# Patient Record
Sex: Female | Born: 1950 | Race: White | Hispanic: No | Marital: Single | Smoking: Former smoker
Health system: Southern US, Community
[De-identification: ages and names within clinical notes are randomized; demographics above are authoritative.]

## PROBLEM LIST (undated history)

## (undated) DIAGNOSIS — I4891 Unspecified atrial fibrillation: Secondary | ICD-10-CM

## (undated) DIAGNOSIS — C801 Malignant (primary) neoplasm, unspecified: Secondary | ICD-10-CM

## (undated) DIAGNOSIS — J449 Chronic obstructive pulmonary disease, unspecified: Secondary | ICD-10-CM

---

## 2019-01-02 ENCOUNTER — Emergency Department (HOSPITAL_BASED_OUTPATIENT_CLINIC_OR_DEPARTMENT_OTHER): Payer: Medicare HMO

## 2019-01-02 ENCOUNTER — Other Ambulatory Visit: Payer: Self-pay

## 2019-01-02 ENCOUNTER — Encounter (HOSPITAL_BASED_OUTPATIENT_CLINIC_OR_DEPARTMENT_OTHER): Payer: Self-pay | Admitting: Emergency Medicine

## 2019-01-02 ENCOUNTER — Emergency Department (HOSPITAL_BASED_OUTPATIENT_CLINIC_OR_DEPARTMENT_OTHER)
Admission: EM | Admit: 2019-01-02 | Discharge: 2019-01-02 | Disposition: A | Discharge status: Home / Self Care | Payer: Medicare HMO | Attending: Emergency Medicine | Admitting: Emergency Medicine

## 2019-01-02 DIAGNOSIS — Z9981 Dependence on supplemental oxygen: Secondary | ICD-10-CM | POA: Diagnosis not present

## 2019-01-02 DIAGNOSIS — R079 Chest pain, unspecified: Secondary | ICD-10-CM | POA: Diagnosis present

## 2019-01-02 DIAGNOSIS — Z79899 Other long term (current) drug therapy: Secondary | ICD-10-CM | POA: Insufficient documentation

## 2019-01-02 DIAGNOSIS — Z85118 Personal history of other malignant neoplasm of bronchus and lung: Secondary | ICD-10-CM | POA: Insufficient documentation

## 2019-01-02 DIAGNOSIS — Z7901 Long term (current) use of anticoagulants: Secondary | ICD-10-CM | POA: Diagnosis not present

## 2019-01-02 DIAGNOSIS — R0602 Shortness of breath: Secondary | ICD-10-CM | POA: Insufficient documentation

## 2019-01-02 DIAGNOSIS — J449 Chronic obstructive pulmonary disease, unspecified: Secondary | ICD-10-CM | POA: Diagnosis not present

## 2019-01-02 DIAGNOSIS — R0789 Other chest pain: Secondary | ICD-10-CM

## 2019-01-02 HISTORY — DX: Unspecified atrial fibrillation: I48.91

## 2019-01-02 HISTORY — DX: Malignant (primary) neoplasm, unspecified: C80.1

## 2019-01-02 HISTORY — DX: Chronic obstructive pulmonary disease, unspecified: J44.9

## 2019-01-02 LAB — CBC WITH DIFFERENTIAL/PLATELET
ABS IMMATURE GRANULOCYTES: 0.26 10*3/uL — AB (ref 0.00–0.07)
BASOS PCT: 0 %
Basophils Absolute: 0 10*3/uL (ref 0.0–0.1)
Eosinophils Absolute: 0 10*3/uL (ref 0.0–0.5)
Eosinophils Relative: 0 %
HCT: 32.7 % — ABNORMAL LOW (ref 36.0–46.0)
Hemoglobin: 9.5 g/dL — ABNORMAL LOW (ref 12.0–15.0)
IMMATURE GRANULOCYTES: 3 %
Lymphocytes Relative: 7 %
Lymphs Abs: 0.6 10*3/uL — ABNORMAL LOW (ref 0.7–4.0)
MCH: 24.6 pg — ABNORMAL LOW (ref 26.0–34.0)
MCHC: 29.1 g/dL — ABNORMAL LOW (ref 30.0–36.0)
MCV: 84.7 fL (ref 80.0–100.0)
Monocytes Absolute: 0.2 10*3/uL (ref 0.1–1.0)
Monocytes Relative: 2 %
NEUTROS ABS: 8 10*3/uL — AB (ref 1.7–7.7)
Neutrophils Relative %: 88 %
PLATELETS: 273 10*3/uL (ref 150–400)
RBC: 3.86 MIL/uL — ABNORMAL LOW (ref 3.87–5.11)
RDW: 17.5 % — ABNORMAL HIGH (ref 11.5–15.5)
WBC: 9.1 10*3/uL (ref 4.0–10.5)
nRBC: 0 % (ref 0.0–0.2)

## 2019-01-02 LAB — RAPID URINE DRUG SCREEN, HOSP PERFORMED
Amphetamines: NOT DETECTED
Barbiturates: NOT DETECTED
Benzodiazepines: NOT DETECTED
Cocaine: NOT DETECTED
Opiates: POSITIVE — AB
Tetrahydrocannabinol: NOT DETECTED

## 2019-01-02 LAB — COMPREHENSIVE METABOLIC PANEL
ALBUMIN: 3.6 g/dL (ref 3.5–5.0)
ALT: 18 U/L (ref 0–44)
AST: 17 U/L (ref 15–41)
Alkaline Phosphatase: 56 U/L (ref 38–126)
Anion gap: 7 (ref 5–15)
BUN: 21 mg/dL (ref 8–23)
CO2: 27 mmol/L (ref 22–32)
Calcium: 8.7 mg/dL — ABNORMAL LOW (ref 8.9–10.3)
Chloride: 103 mmol/L (ref 98–111)
Creatinine, Ser: 0.61 mg/dL (ref 0.44–1.00)
GFR calc Af Amer: >60 mL/min (ref >60)
GFR calc non Af Amer: >60 mL/min (ref >60)
Glucose, Bld: 135 mg/dL — ABNORMAL HIGH (ref 70–99)
POTASSIUM: 4.2 mmol/L (ref 3.5–5.1)
Sodium: 137 mmol/L (ref 135–145)
Total Bilirubin: 0.6 mg/dL (ref 0.3–1.2)
Total Protein: 6.4 g/dL — ABNORMAL LOW (ref 6.5–8.1)

## 2019-01-02 LAB — TROPONIN I: Troponin I: <0.03 ng/mL (ref <0.03)

## 2019-01-02 LAB — BRAIN NATRIURETIC PEPTIDE: B Natriuretic Peptide: 78 pg/mL (ref 0.0–100.0)

## 2019-01-02 MED ORDER — IOPAMIDOL (ISOVUE-370) INJECTION 76%
100.0000 mL | Freq: Once | INTRAVENOUS | Status: AC | PRN
  Administered 2019-01-02: 71 mL via INTRAVENOUS

## 2019-01-02 MED ORDER — GABAPENTIN 600 MG PO TABS: 600.0000 mg | ORAL_TABLET | Freq: Three times a day (TID) | ORAL | 0 refills | Status: DC

## 2019-01-02 MED ORDER — OXYCODONE-ACETAMINOPHEN 5-325 MG PO TABS
2.0000 | ORAL_TABLET | Freq: Once | ORAL | Status: AC
  Administered 2019-01-02: 2 via ORAL
  Filled 2019-01-02: qty 2

## 2019-01-02 MED ORDER — MORPHINE SULFATE ER 30 MG PO TBCR
30.0000 mg | EXTENDED_RELEASE_TABLET | Freq: Once | ORAL | Status: DC
  Filled 2019-01-02: qty 1

## 2019-01-02 MED ORDER — IPRATROPIUM-ALBUTEROL 0.5-2.5 (3) MG/3ML IN SOLN
3.0000 mL | Freq: Once | RESPIRATORY_TRACT | Status: AC
  Administered 2019-01-02: 3 mL via RESPIRATORY_TRACT
  Filled 2019-01-02: qty 3

## 2019-01-02 NOTE — ED Provider Notes (Signed)
[facility] Provider Note   CSN: [id number] Arrival date & time: 01/02/19  1650    History   Chief Complaint Chief Complaint  Patient presents with   Shortness of Breath    HPI Chirsty Austin is a 68 y.o. female.     68 year old female with past medical history including lung cancer, atrial fibrillation on Eliquis, COPD on 2 L home oxygen, chronic narcotic use who presents with chest pain and shortness of breath.  Patient states that she has been living in [place] where she has been following with an oncologist.  She states that she was getting injections for her cancer but has not followed up in 4 months to receive this medication.  She was recently admitted to the hospital for COPD and chest pain. She was discharged on 3/10 with levaquin, prednisone and completed these medications. She lives with family and family decided to move to [place] relatively suddenly 4 days ago. She was unpacking the next 2 days and did not call any clinics to establish care here. She reports that she takes MS contin and hydrocodone daily for chronic chest and R shoulder blade pain related to her lung cancer and she ran out of the medication 4 days ago. She has still been taking all of her other medications and denies missing any doses of Eliquis.  Today she began having worsening chest pain and breathing problems worse than her baseline.  She has mild cough but no fevers or sick contacts.  She reports that her feet swelled up while she was riding in the car when they moved but this has improved.  She denies any vomiting or diarrhea.  The history is provided by the patient.  Shortness of Breath    Past Medical History:  Diagnosis Date   Atrial fibrillation ([place])    Cancer ([place])    COPD (chronic obstructive pulmonary disease) ([place])     There are no active problems to display for this patient.   History reviewed. No pertinent surgical history.   OB History   No  obstetric history on file.      Home Medications    Prior to Admission medications   Medication Sig Start Date End Date Taking? Authorizing Provider  albuterol (ACCUNEB) 1.25 MG/3ML nebulizer solution Take 1 ampule by nebulization every 6 (six) hours as needed for wheezing.   Yes [provider]  ALPRAZolam Duanne Moron) 1 MG tablet Take 1 mg by mouth at bedtime as needed for anxiety.   Yes [provider]  apixaban (ELIQUIS) 5 MG TABS tablet Take 5 mg by mouth 2 (two) times daily.   Yes [provider]  atorvastatin (LIPITOR) 40 MG tablet Take 40 mg by mouth daily.   Yes [provider]  carvedilol (COREG) 6.25 MG tablet Take 6.25 mg by mouth 2 (two) times daily with a meal.   Yes [provider]  diltiazem (TIAZAC) 180 MG 24 hr capsule Take 180 mg by mouth daily.   Yes [provider]  Fluticasone-Umeclidin-Vilant (TRELEGY ELLIPTA) 100-62.5-25 MCG/INH AEPB Inhale into the lungs.   Yes [provider]  HYDROcodone-acetaminophen (NORCO) 7.5-325 MG tablet Take 1 tablet by mouth every 4 (four) hours as needed for moderate pain.   Yes [provider]  morphine (MS CONTIN) 15 MG 12 hr tablet Take 15 mg by mouth every 12 (twelve) hours. 2 tabs by mouth in the morning' 1 tab in the afternoon and 2 tabs at bedtime   Yes  [provider]  pantoprazole (PROTONIX) 40 MG tablet Take 40 mg by mouth daily.   Yes [provider]  sotalol (BETAPACE) 80 MG tablet Take 80 mg by mouth 2 (two) times daily.   Yes [provider]  sucralfate (CARAFATE) 1 g tablet Take 1 g by mouth 4 (four) times daily -  with meals and at bedtime.   Yes [provider]  gabapentin (NEURONTIN) 600 MG tablet Take 1 tablet (600 mg total) by mouth 3 (three) times daily for 7 days. 01/02/19 01/09/19  Mikhael Hendriks, Wenda Overland, MD    Family History History reviewed. No pertinent family history.  Social History Social History   Tobacco  Use   Smoking status: Not on file  Substance Use Topics   Alcohol use: Never    Frequency: Never   Drug use: Never     Allergies   Aspirin   Review of Systems Review of Systems  Respiratory: Positive for shortness of breath.    All other systems reviewed and are negative except that which was mentioned in HPI   Physical Exam Updated Vital Signs BP 101/62    Pulse (!) 59    Temp 98.6 F (37 C) (Oral)    Resp 15    Ht 5\' 4"  (1.626 m)    Wt 72.6 kg    SpO2 99%    BMI 27.46 kg/m   Physical Exam Vitals signs and nursing note reviewed.  Constitutional:      General: She is not in acute distress.    Appearance: She is well-developed.     Comments: Chronically ill-appearing  HENT:     Head: Normocephalic and atraumatic.  Eyes:     Conjunctiva/sclera: Conjunctivae normal.     Pupils: Pupils are equal, round, and reactive to light.  Neck:     Musculoskeletal: Neck supple.  Cardiovascular:     Rate and Rhythm: Normal rate and regular rhythm.     Heart sounds: Normal heart sounds. No murmur.  Pulmonary:     Comments: Mild tachypnea without respiratory distress, mild wheezes in right lung fields Abdominal:     General: Bowel sounds are normal. There is no distension.     Palpations: Abdomen is soft.     Tenderness: There is no abdominal tenderness.  Musculoskeletal:     Right lower leg: No edema.     Left lower leg: No edema.  Skin:    General: Skin is warm and dry.  Neurological:     Mental Status: She is alert and oriented to person, place, and time.     Comments: Fluent speech  Psychiatric:        Mood and Affect: Mood is anxious.        Judgment: Judgment normal.      ED Treatments / Results  Labs (all labs ordered are listed, but only abnormal results are displayed) Labs Reviewed  COMPREHENSIVE METABOLIC PANEL - Abnormal; Notable for the following components:      Result Value   Glucose, Bld 135 (*)    Calcium 8.7 (*)    Total Protein 6.4 (*)    All  other components within normal limits  CBC WITH DIFFERENTIAL/PLATELET - Abnormal; Notable for the following components:   RBC 3.86 (*)    Hemoglobin 9.5 (*)    HCT 32.7 (*)    MCH 24.6 (*)    MCHC 29.1 (*)    RDW 17.5 (*)    Neutro Abs 8.0 (*)  Lymphs Abs 0.6 (*)    Abs Immature Granulocytes 0.26 (*)    All other components within normal limits  BRAIN NATRIURETIC PEPTIDE  TROPONIN I  RAPID URINE DRUG SCREEN, HOSP PERFORMED    EKG EKG Interpretation  Date/Time:  Sunday January 02 2019 16:59:55 EDT Ventricular Rate:  66 PR Interval:    QRS Duration: 95 QT Interval:  437 QTC Calculation: 458 R Axis:   10 Text Interpretation:  Sinus rhythm Borderline T abnormalities, inferior leads Baseline wander in lead(s) V6 No previous ECGs available Confirmed by Theotis Burrow [phone]) on 01/02/2019 5:09:39 PM   Radiology Dg Chest 2 View  Result Date: 01/02/2019 CLINICAL DATA:  History of lung cancer.  COPD. EXAM: CHEST - 2 VIEW COMPARISON:  None. FINDINGS: Right anterior chest wall Port-A-Cath is present with tip projecting the superior vena cava. Cardiac contours upper limits of normal. Pulmonary hyperinflation. Bibasilar heterogeneous opacities. No pleural effusion or pneumothorax. Thoracic spine degenerative changes. IMPRESSION: Bibasilar heterogeneous opacities may represent atelectasis or infection. Electronically Signed   By: Lovey Newcomer M.D.   On: 01/02/2019 18:13   Ct Angio Chest Pe W/cm &/or Wo Cm  Result Date: 01/02/2019 CLINICAL DATA:  Pt with COPD experiencing increasing SOB since trip from [place] to [place] a few days ago, on oxygen, hx AFIBHx lung cancer with chemo and radiation EXAM: CT ANGIOGRAPHY CHEST WITH CONTRAST TECHNIQUE: Multidetector CT imaging of the chest was performed using the standard protocol during bolus administration of intravenous contrast. Multiplanar CT image reconstructions and MIPs were obtained to evaluate the vascular anatomy. CONTRAST:  31mL ISOVUE-370  IOPAMIDOL (ISOVUE-370) INJECTION 76% COMPARISON:  Current chest radiograph. FINDINGS: Cardiovascular: There is satisfactory opacification of the pulmonary arteries to the segmental level. There is no evidence of a pulmonary embolism. Heart is normal in size. No pericardial effusion. Minimal left coronary artery calcifications. Main pulmonary artery is prominent measuring 3.6 cm. Mild dilation of the right pulmonary artery, 3.0 cm. Aorta is normal in caliber. Mild aortic atherosclerotic disease noted. No aortic dissection. Mediastinum/Nodes: There Is abnormal soft tissue along the superior right hilum measuring approximately 2.9 x 2.2 x 2.5 cm. The right upper lobe bronchus is occluded at the level of the soft tissue. This may be chronic due to treatment related radiation scarring. Residual/recurrent lung carcinoma is not excluded. No enlarged mediastinal, hilar, or axillary lymph nodes. Thyroid gland, trachea, and esophagus demonstrate no significant findings. Lungs/Pleura: There is patchy and linear/reticular opacities extending posteriorly from the right hilar structures consistent with treatment related scarring. One component of this centered on image 38, series 6, is more nodular in configuration, measuring 17 x 11 x 10 mm. No other evidence of a nodule. No lung masses. Despite occlusion of the right lobe bronchus, the upper lobe is aerated presumably from collateral drift. Additional linear and reticular type opacities noted most evident in the anteromedial left upper lobe and at the lung bases consistent with a combination of scarring and subsegmental atelectasis. 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 There is no evidence of pneumonia and no pulmonary edema. Advanced changes of centrilobular emphysema are noted bilaterally. No pleural effusion or pneumothorax. Upper Abdomen: No acute abnormality. Musculoskeletal: No acute fracture. No osteoblastic or osteolytic lesions. Review of the MIP images confirms  the above findings. IMPRESSION: 1. No evidence of a pulmonary embolism. 2. No acute findings. Lung base opacities noted on the current chest radiograph are due to a combination of chronic appearing lung base scarring and/or subsegmental atelectasis and bilateral Bochdalek's hernias.  3. Abnormal soft tissue surrounds the superior right hilar structures occluding the right upper lobe bronchus. This may be chronic scarring from radiation therapy. However, recurrent/residual tumor is also possible. Posterior to this is an irregular nodule measuring 17 x 10 x 11 mm, confluent with linear/reticular opacities. This may all be scarring. The nodular component could reflect active neoplastic disease. Comparison with prior chest CTs, if they are available, would be helpful to establish the chronicity of these findings. Otherwise, recommend short-term follow-up with repeat chest CT in 2 3 months to reassess for stability. 4. Advanced emphysema. 5. Mild aortic atherosclerosis. Aortic Atherosclerosis (ICD10-I70.0) and Emphysema (ICD10-J43.9). Electronically Signed   By: Lajean Manes M.D.   On: 01/02/2019 19:25    Procedures Procedures (including critical care time)  Medications Ordered in ED Medications  ipratropium-albuterol (DUONEB) 0.5-2.5 (3) MG/3ML nebulizer solution 3 mL (3 mLs Nebulization Given 01/02/19 1730)  oxyCODONE-acetaminophen (PERCOCET/ROXICET) 5-325 MG per tablet 2 tablet (2 tablets Oral Given 01/02/19 1811)  iopamidol (ISOVUE-370) 76 % injection 100 mL (71 mLs Intravenous Contrast Given 01/02/19 1843)     Initial Impression / Assessment and Plan / ED Course  I have reviewed the triage vital signs and the nursing notes.  Pertinent labs & imaging results that were available during my care of the patient were reviewed by me and considered in my medical decision making (see chart for details).        Chronically ill-appearing but nontoxic on exam, afebrile, vital signs stable on home oxygen  level.  Occasional wheeze but no significant wheezing on exam.  She did appear somewhat anxious during conversation.  Lab work shows normal CMP, negative troponin and BNP, normal WBC count, anemia with hemoglobin of 9.5 is not surprising given underlying cancer.  Chest x-ray showed questionable opacities therefore obtain CTA for more information.  CTA negative for PE, no acute findings.  She has scarring, advanced emphysema, and several areas that may be scarring versus residual tumor.  She has no indication for repeat course of antibiotics and I do not feel that she needs another steroid course based on current exam.  I have emphasized the importance of establishing care with an oncologist in this area as soon as possible.  I spoke with oncologist on-call, Dr. Walden Field, who took the patient's information and will notify the clinic.  Provided patient with contact information for Dr. Marin Olp upstairs.  She asked multiple times for narcotic prescriptions but I explained that I do not provide narcotics for any chronic pain conditions especially given that I have no background information on the patient or previous charts/workup.  I explained that she would have to establish care with a provider to discuss pain management options.  I did provide with refill of gabapentin as she says she is about to run out of the medication.  She states she has all of her other medications.  Emphasized importance of medication compliance especially with Eliquis.  Return precautions reviewed. Final Clinical Impressions(s) / ED Diagnoses   Final diagnoses:  Shortness of breath  Atypical chest pain    ED Discharge Orders         Ordered    gabapentin (NEURONTIN) 600 MG tablet  3 times daily     01/02/19 1956           Leslyn Monda, Wenda Overland, MD 01/02/19 2002

## 2019-01-02 NOTE — ED Notes (Signed)
Pt states she is not driving, aware she needs a driver when discharged.

## 2019-01-02 NOTE — Discharge Instructions (Signed)
IT IS VERY IMPORTANT FOR YOU TO ESTABLISH CARE WITH AN ONCOLOGIST (CANCER DOCTOR) HERE OR AT [facility]. CALL IN THE MORNING TO SCHEDULE APPOINTMENT. CONTINUE ALL YOUR MEDICATIONS.

## 2019-01-02 NOTE — ED Triage Notes (Addendum)
Reports history of COPD with shortness of breath which began today.  Oxygen saturation 88% on RA.  States she currently wears 2L O2 via nasal cannula at all times.  Denies productive cough, fever.  Recent extended period of sitting when moving from [place] last week.  States that she was seen from same 1 week ago.  To room via wheelchair.  Oxygen saturation increased to 96% on 2L oxygen.

## 2019-01-02 NOTE — ED Notes (Signed)
ED Provider at bedside. 

## 2019-01-03 ENCOUNTER — Emergency Department (HOSPITAL_COMMUNITY)
Admission: EM | Admit: 2019-01-03 | Discharge: 2019-01-03 | Disposition: A | Discharge status: Home / Self Care | Payer: Medicare HMO | Attending: Emergency Medicine | Admitting: Emergency Medicine

## 2019-01-03 ENCOUNTER — Telehealth: Payer: Self-pay | Admitting: *Deleted

## 2019-01-03 ENCOUNTER — Encounter (HOSPITAL_COMMUNITY): Payer: Self-pay | Admitting: Emergency Medicine

## 2019-01-03 ENCOUNTER — Other Ambulatory Visit: Payer: Self-pay

## 2019-01-03 ENCOUNTER — Telehealth: Payer: Self-pay | Admitting: Hematology & Oncology

## 2019-01-03 ENCOUNTER — Emergency Department (HOSPITAL_COMMUNITY): Payer: Medicare HMO

## 2019-01-03 DIAGNOSIS — R0602 Shortness of breath: Secondary | ICD-10-CM | POA: Diagnosis present

## 2019-01-03 DIAGNOSIS — Z79899 Other long term (current) drug therapy: Secondary | ICD-10-CM | POA: Insufficient documentation

## 2019-01-03 DIAGNOSIS — Z7901 Long term (current) use of anticoagulants: Secondary | ICD-10-CM | POA: Insufficient documentation

## 2019-01-03 DIAGNOSIS — Z87891 Personal history of nicotine dependence: Secondary | ICD-10-CM | POA: Insufficient documentation

## 2019-01-03 DIAGNOSIS — J441 Chronic obstructive pulmonary disease with (acute) exacerbation: Secondary | ICD-10-CM | POA: Insufficient documentation

## 2019-01-03 DIAGNOSIS — I4891 Unspecified atrial fibrillation: Secondary | ICD-10-CM | POA: Diagnosis not present

## 2019-01-03 LAB — BASIC METABOLIC PANEL
Anion gap: 9 (ref 5–15)
BUN: 24 mg/dL — ABNORMAL HIGH (ref 8–23)
CO2: 28 mmol/L (ref 22–32)
Calcium: 9.1 mg/dL (ref 8.9–10.3)
Chloride: 106 mmol/L (ref 98–111)
Creatinine, Ser: 0.77 mg/dL (ref 0.44–1.00)
GFR calc Af Amer: >60 mL/min (ref >60)
GFR calc non Af Amer: >60 mL/min (ref >60)
Glucose, Bld: 95 mg/dL (ref 70–99)
Potassium: 3.4 mmol/L — ABNORMAL LOW (ref 3.5–5.1)
Sodium: 143 mmol/L (ref 135–145)

## 2019-01-03 LAB — CBC
HCT: 32.1 % — ABNORMAL LOW (ref 36.0–46.0)
Hemoglobin: 9.2 g/dL — ABNORMAL LOW (ref 12.0–15.0)
MCH: 24.5 pg — ABNORMAL LOW (ref 26.0–34.0)
MCHC: 28.7 g/dL — ABNORMAL LOW (ref 30.0–36.0)
MCV: 85.4 fL (ref 80.0–100.0)
PLATELETS: 295 10*3/uL (ref 150–400)
RBC: 3.76 MIL/uL — AB (ref 3.87–5.11)
RDW: 17.8 % — ABNORMAL HIGH (ref 11.5–15.5)
WBC: 8.6 10*3/uL (ref 4.0–10.5)
nRBC: 0 % (ref 0.0–0.2)

## 2019-01-03 MED ORDER — HYDROCODONE-ACETAMINOPHEN 5-325 MG PO TABS: 1.0000 | ORAL_TABLET | Freq: Four times a day (QID) | ORAL | 0 refills | Status: DC | PRN

## 2019-01-03 MED ORDER — MAGNESIUM SULFATE 2 GM/50ML IV SOLN
2.0000 g | Freq: Once | INTRAVENOUS | Status: AC
  Administered 2019-01-03: 2 g via INTRAVENOUS
  Filled 2019-01-03: qty 50

## 2019-01-03 MED ORDER — ALBUTEROL SULFATE (2.5 MG/3ML) 0.083% IN NEBU
5.0000 mg | INHALATION_SOLUTION | Freq: Once | RESPIRATORY_TRACT | Status: AC
  Administered 2019-01-03: 5 mg via RESPIRATORY_TRACT
  Filled 2019-01-03: qty 6

## 2019-01-03 MED ORDER — OXYCODONE-ACETAMINOPHEN 5-325 MG PO TABS
1.0000 | ORAL_TABLET | Freq: Once | ORAL | Status: AC
  Administered 2019-01-03: 1 via ORAL
  Filled 2019-01-03: qty 1

## 2019-01-03 MED ORDER — PREDNISONE 20 MG PO TABS: ORAL_TABLET | ORAL | 0 refills | Status: DC

## 2019-01-03 MED ORDER — METHYLPREDNISOLONE SODIUM SUCC 125 MG IJ SOLR
125.0000 mg | Freq: Once | INTRAMUSCULAR | Status: AC
  Administered 2019-01-03: 125 mg via INTRAVENOUS
  Filled 2019-01-03: qty 2

## 2019-01-03 NOTE — Telephone Encounter (Signed)
Call from [facility] at [place] regarding pt records. RN advised she will fax pt records for MD to review.

## 2019-01-03 NOTE — ED Triage Notes (Signed)
Pt with COPD reports shortness of breath, back and chest discomfort. She reports symptoms have been on going for a couple of days seen at [facility] received a breathing treatment but symptoms persists. She wears 2L home oxygen, saturations of 85% now 90 on  3L. Denies fever or cough.   Hx of Lung CA.

## 2019-01-03 NOTE — Discharge Instructions (Addendum)
Follow up with her doctor tomorrow as planned

## 2019-01-03 NOTE — Telephone Encounter (Signed)
Received call from patient to sch office visit to refill medications per ER visit over the weekend. Transferred call to desk RN for patient to speak with RN. Placed patient on Dr Marin Olp schedule for 01/20/19 at 0800

## 2019-01-03 NOTE — Telephone Encounter (Signed)
Call to pt regarding records and prior treatment and location. Pt advised the following:  She lived in [place] w/son treated w/Radiation and 6 treatments of Chemotherapy ( 1 infusion q 3wks, unsure name of drug(s) at [facility].  Pt was last seen 1/27 by Dr. Ivin Poot and had a CT scan. She was advised the mass was gone. Pt did not receive her scheduled treatment that day as she did not have time.Pt then left [place] abruptly due to not getting along w/her son and moved in w/her daughter Carolyne Fiscal in [place]  While in [place] pt did not receive any care or treatments. She again abruptly left [place] on Wednesday 3/11 with her daughter who moved here to be with her boyfriend. Pt has an appt w/ Ali Lowe, NP for management of Pain. Discussed with pt I will be reaching out to [facility] in [place] for records. If we are unable to get pt records appt on 4/2 may need to be rescheduled. Pt thanked me for the call, no further concerns at this time [facility], message left with answering service regarding mutual pt records, requested Dr. Ivin Poot nurse/office call back as pt has upcoming appt.

## 2019-01-03 NOTE — ED Provider Notes (Signed)
[facility] EMERGENCY DEPARTMENT Provider Note   CSN: [id number] Arrival date & time: 01/03/19  1604    History   Chief Complaint Chief Complaint  Patient presents with   COPD   Shortness of Breath    HPI Sophia Austin is a 68 y.o. female.     . She complained of bowel shortness of breath with  Left lower back pain.   Patient was seen yesterday in the emergency department had a CT angiogram the chest that was negative for PE  The history is provided by the patient. No language interpreter was used.  COPD  This is a recurrent problem. The current episode started more than 2 days ago. The problem occurs constantly. The problem has not changed since onset.Pertinent negatives include no chest pain, no abdominal pain and no headaches. Nothing aggravates the symptoms. She has tried nothing for the symptoms. The treatment provided no relief.    Past Medical History:  Diagnosis Date   Atrial fibrillation ([place])    Cancer ([place])    COPD (chronic obstructive pulmonary disease) ([place])     There are no active problems to display for this patient.   History reviewed. No pertinent surgical history.   OB History   No obstetric history on file.      Home Medications    Prior to Admission medications   Medication Sig Start Date End Date Taking? Authorizing Provider  albuterol (ACCUNEB) 1.25 MG/3ML nebulizer solution Take 1 ampule by nebulization every 6 (six) hours as needed for wheezing.   Yes [provider]  albuterol (PROVENTIL HFA;VENTOLIN HFA) 108 (90 Base) MCG/ACT inhaler Inhale 2 puffs into the lungs every 6 (six) hours as needed for wheezing or shortness of breath.   Yes [provider]  ALPRAZolam Duanne Moron) 1 MG tablet Take 1 mg by mouth at bedtime as needed for anxiety.   Yes [provider]  apixaban (ELIQUIS) 5 MG TABS tablet Take 5 mg by mouth 2 (two) times daily.   Yes [provider]  atorvastatin (LIPITOR)  40 MG tablet Take 40 mg by mouth daily.   Yes [provider]  carvedilol (COREG) 6.25 MG tablet Take 6.25 mg by mouth 2 (two) times daily with a meal.   Yes [provider]  diltiazem (TIAZAC) 180 MG 24 hr capsule Take 180 mg by mouth daily.   Yes [provider]  Fluticasone-Umeclidin-Vilant (TRELEGY ELLIPTA) 100-62.5-25 MCG/INH AEPB Inhale 1 puff into the lungs daily.    Yes [provider]  gabapentin (NEURONTIN) 600 MG tablet Take 1 tablet (600 mg total) by mouth 3 (three) times daily for 7 days. 01/02/19 01/09/19 Yes Little, Wenda Overland, MD  morphine (MS CONTIN) 15 MG 12 hr tablet Take 15 mg by mouth every 12 (twelve) hours. 2 tabs by mouth in the morning' 1 tab in the afternoon and 2 tabs at bedtime   Yes [provider]  pantoprazole (PROTONIX) 40 MG tablet Take 40 mg by mouth daily.   Yes [provider]  sotalol (BETAPACE) 80 MG tablet Take 80 mg by mouth 2 (two) times daily.   Yes [provider]  sucralfate (CARAFATE) 1 g tablet Take 1 g by mouth 4 (four) times daily -  with meals and at bedtime.   Yes [provider]  HYDROcodone-acetaminophen (NORCO/VICODIN) 5-325 MG tablet Take 1 tablet by mouth every 6 (six) hours as needed for moderate pain. 01/03/19   Milton Ferguson, MD  predniSONE (DELTASONE) 20  MG tablet 2 tabs po daily x 3 days 01/03/19   Milton Ferguson, MD    Family History No family history on file.  Social History Social History   Tobacco Use   Smoking status: Former Smoker   Smokeless tobacco: Never Used  Substance Use Topics   Alcohol use: Never    Frequency: Never   Drug use: Never     Allergies   Aspirin   Review of Systems Review of Systems  Constitutional: Negative for appetite change and fatigue.  HENT: Negative for congestion, ear discharge and sinus pressure.   Eyes: Negative for discharge.  Respiratory: Positive for wheezing. Negative for cough.   Cardiovascular: Negative  for chest pain.  Gastrointestinal: Negative for abdominal pain and diarrhea.  Genitourinary: Negative for frequency and hematuria.  Musculoskeletal: Negative for back pain.  Skin: Negative for rash.  Neurological: Negative for seizures and headaches.  Psychiatric/Behavioral: Negative for hallucinations.     Physical Exam Updated Vital Signs BP (!) 117/48 (BP Location: Right Arm)    Pulse 68    Temp 97.8 F (36.6 C) (Oral)    Resp 16    Ht 5\' 4"  (1.626 m)    Wt 71.7 kg    SpO2 98%    BMI 27.12 kg/m   Physical Exam Vitals signs and nursing note reviewed.  Constitutional:      Appearance: She is well-developed.  HENT:     Head: Normocephalic.     Nose: Nose normal.  Eyes:     General: No scleral icterus.    Conjunctiva/sclera: Conjunctivae normal.  Neck:     Musculoskeletal: Neck supple.     Thyroid: No thyromegaly.  Cardiovascular:     Rate and Rhythm: Normal rate and regular rhythm.     Heart sounds: No murmur. No friction rub. No gallop.   Pulmonary:     Breath sounds: No stridor. Wheezing present. No rales.  Chest:     Chest wall: No tenderness.  Abdominal:     General: There is no distension.     Tenderness: There is no abdominal tenderness. There is no rebound.  Musculoskeletal: Normal range of motion.  Lymphadenopathy:     Cervical: No cervical adenopathy.  Skin:    Findings: No erythema or rash.  Neurological:     Mental Status: She is oriented to person, place, and time.     Motor: No abnormal muscle tone.     Coordination: Coordination normal.  Psychiatric:        Behavior: Behavior normal.      ED Treatments / Results  Labs (all labs ordered are listed, but only abnormal results are displayed) Labs Reviewed  BASIC METABOLIC PANEL - Abnormal; Notable for the following components:      Result Value   Potassium 3.4 (*)    BUN 24 (*)    All other components within normal limits  CBC - Abnormal; Notable for the following components:   RBC 3.76 (*)      Hemoglobin 9.2 (*)    HCT 32.1 (*)    MCH 24.5 (*)    MCHC 28.7 (*)    RDW 17.8 (*)    All other components within normal limits    EKG None  Radiology Dg Chest 2 View  Result Date: 01/03/2019 CLINICAL DATA:  Sob. Pt stated that she's been feeling sob w/ or w/out exertion for 2 days. Pt also experiencing some middle chest pain. Pt mentioned that she has A-fib and stated that  her heart has been "beating weird". Hx; COPD, A-fib, former smoker of 50 years, quit 1 year ago. EXAM: CHEST - 2 VIEW COMPARISON:  01/02/2019 FINDINGS: Cardiac silhouette is normal in size and configuration. No mediastinal or hilar masses. No convincing adenopathy. Lungs are hyperexpanded. Stable scarring above and posterior to the right hilum. Mild reticular scarring or subsegmental atelectasis in the anterior lung bases. No evidence of pneumonia or pulmonary edema. No pleural effusion or pneumothorax. Right anterior chest wall Port-A-Cath is stable, tip lying in the lower superior vena cava. Skeletal structures are intact. IMPRESSION: 1. No acute cardiopulmonary disease. 2. COPD. Right sided scarring projecting posterior to the superior right hilum, presumably treatment related scarring in this patient with a reported history of lung carcinoma. Electronically Signed   By: Lajean Manes M.D.   On: 01/03/2019 19:11   Dg Chest 2 View  Result Date: 01/02/2019 CLINICAL DATA:  History of lung cancer.  COPD. EXAM: CHEST - 2 VIEW COMPARISON:  None. FINDINGS: Right anterior chest wall Port-A-Cath is present with tip projecting the superior vena cava. Cardiac contours upper limits of normal. Pulmonary hyperinflation. Bibasilar heterogeneous opacities. No pleural effusion or pneumothorax. Thoracic spine degenerative changes. IMPRESSION: Bibasilar heterogeneous opacities may represent atelectasis or infection. Electronically Signed   By: Lovey Newcomer M.D.   On: 01/02/2019 18:13   Ct Angio Chest Pe W/cm &/or Wo Cm  Result Date:  01/02/2019 CLINICAL DATA:  Pt with COPD experiencing increasing SOB since trip from [place] to [place] a few days ago, on oxygen, hx AFIBHx lung cancer with chemo and radiation EXAM: CT ANGIOGRAPHY CHEST WITH CONTRAST TECHNIQUE: Multidetector CT imaging of the chest was performed using the standard protocol during bolus administration of intravenous contrast. Multiplanar CT image reconstructions and MIPs were obtained to evaluate the vascular anatomy. CONTRAST:  10mL ISOVUE-370 IOPAMIDOL (ISOVUE-370) INJECTION 76% COMPARISON:  Current chest radiograph. FINDINGS: Cardiovascular: There is satisfactory opacification of the pulmonary arteries to the segmental level. There is no evidence of a pulmonary embolism. Heart is normal in size. No pericardial effusion. Minimal left coronary artery calcifications. Main pulmonary artery is prominent measuring 3.6 cm. Mild dilation of the right pulmonary artery, 3.0 cm. Aorta is normal in caliber. Mild aortic atherosclerotic disease noted. No aortic dissection. Mediastinum/Nodes: There Is abnormal soft tissue along the superior right hilum measuring approximately 2.9 x 2.2 x 2.5 cm. The right upper lobe bronchus is occluded at the level of the soft tissue. This may be chronic due to treatment related radiation scarring. Residual/recurrent lung carcinoma is not excluded. No enlarged mediastinal, hilar, or axillary lymph nodes. Thyroid gland, trachea, and esophagus demonstrate no significant findings. Lungs/Pleura: There is patchy and linear/reticular opacities extending posteriorly from the right hilar structures consistent with treatment related scarring. One component of this centered on image 38, series 6, is more nodular in configuration, measuring 17 x 11 x 10 mm. No other evidence of a nodule. No lung masses. Despite occlusion of the right lobe bronchus, the upper lobe is aerated presumably from collateral drift. Additional linear and reticular type opacities noted most evident  in the anteromedial left upper lobe and at the lung bases consistent with a combination of scarring and subsegmental atelectasis. 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 There is no evidence of pneumonia and no pulmonary edema. Advanced changes of centrilobular emphysema are noted bilaterally. No pleural effusion or pneumothorax. Upper Abdomen: No acute abnormality. Musculoskeletal: No acute fracture. No osteoblastic or osteolytic lesions. Review of the MIP images confirms the above  findings. IMPRESSION: 1. No evidence of a pulmonary embolism. 2. No acute findings. Lung base opacities noted on the current chest radiograph are due to a combination of chronic appearing lung base scarring and/or subsegmental atelectasis and bilateral Bochdalek's hernias. 3. Abnormal soft tissue surrounds the superior right hilar structures occluding the right upper lobe bronchus. This may be chronic scarring from radiation therapy. However, recurrent/residual tumor is also possible. Posterior to this is an irregular nodule measuring 17 x 10 x 11 mm, confluent with linear/reticular opacities. This may all be scarring. The nodular component could reflect active neoplastic disease. Comparison with prior chest CTs, if they are available, would be helpful to establish the chronicity of these findings. Otherwise, recommend short-term follow-up with repeat chest CT in 2 3 months to reassess for stability. 4. Advanced emphysema. 5. Mild aortic atherosclerosis. Aortic Atherosclerosis (ICD10-I70.0) and Emphysema (ICD10-J43.9). Electronically Signed   By: Lajean Manes M.D.   On: 01/02/2019 19:25    Procedures Procedures (including critical care time)  Medications Ordered in ED Medications  albuterol (PROVENTIL) (2.5 MG/3ML) 0.083% nebulizer solution 5 mg (5 mg Nebulization Given 01/03/19 1644)  methylPREDNISolone sodium succinate (SOLU-MEDROL) 125 mg/2 mL injection 125 mg (125 mg Intravenous Given 01/03/19 1732)  magnesium sulfate IVPB  2 g 50 mL (0 g Intravenous Stopped 01/03/19 1916)  oxyCODONE-acetaminophen (PERCOCET/ROXICET) 5-325 MG per tablet 1 tablet (1 tablet Oral Given 01/03/19 1730)  oxyCODONE-acetaminophen (PERCOCET/ROXICET) 5-325 MG per tablet 1 tablet (1 tablet Oral Given 01/03/19 2033)     Initial Impression / Assessment and Plan / ED Course  I have reviewed the triage vital signs and the nursing notes.  Pertinent labs & imaging results that were available during my care of the patient were reviewed by me and considered in my medical decision making (see chart for details).        X-rays unremarkable. Patient with mild exacerbation of COPD. She improved but never treated with Zomig sent home with some prednisone also given some pain medicine for her musculoskeletal pain in her left flank area. Patient is to see her family doctor tomorrow  Final Clinical Impressions(s) / ED Diagnoses   Final diagnoses:  COPD exacerbation [facility])    ED Discharge Orders         Ordered    predniSONE (DELTASONE) 20 MG tablet     01/03/19 2049    HYDROcodone-acetaminophen (NORCO/VICODIN) 5-325 MG tablet  Every 6 hours PRN     01/03/19 2049           Milton Ferguson, MD 01/03/19 2053

## 2019-01-03 NOTE — ED Notes (Signed)
Patient transported to X-ray 

## 2019-01-04 ENCOUNTER — Telehealth: Payer: Self-pay | Admitting: Family Medicine

## 2019-01-04 ENCOUNTER — Other Ambulatory Visit: Payer: Self-pay

## 2019-01-04 ENCOUNTER — Encounter: Payer: Self-pay | Admitting: Family Medicine

## 2019-01-04 ENCOUNTER — Ambulatory Visit (INDEPENDENT_AMBULATORY_CARE_PROVIDER_SITE_OTHER): Payer: Medicare HMO | Admitting: Family Medicine

## 2019-01-04 VITALS — BP 136/80 | HR 87 | Temp 97.8°F | Ht 64.0 in | Wt 158.0 lb

## 2019-01-04 DIAGNOSIS — I2510 Atherosclerotic heart disease of native coronary artery without angina pectoris: Secondary | ICD-10-CM | POA: Diagnosis not present

## 2019-01-04 DIAGNOSIS — I480 Paroxysmal atrial fibrillation: Secondary | ICD-10-CM | POA: Insufficient documentation

## 2019-01-04 DIAGNOSIS — C3401 Malignant neoplasm of right main bronchus: Secondary | ICD-10-CM | POA: Diagnosis not present

## 2019-01-04 DIAGNOSIS — G894 Chronic pain syndrome: Secondary | ICD-10-CM | POA: Insufficient documentation

## 2019-01-04 DIAGNOSIS — J449 Chronic obstructive pulmonary disease, unspecified: Secondary | ICD-10-CM | POA: Insufficient documentation

## 2019-01-04 DIAGNOSIS — G893 Neoplasm related pain (acute) (chronic): Secondary | ICD-10-CM

## 2019-01-04 MED ORDER — GABAPENTIN 600 MG PO TABS: 600.0000 mg | ORAL_TABLET | Freq: Three times a day (TID) | ORAL | 0 refills | Status: AC

## 2019-01-04 MED ORDER — MORPHINE SULFATE ER 15 MG PO TBCR: 15.0000 mg | EXTENDED_RELEASE_TABLET | Freq: Two times a day (BID) | ORAL | 0 refills | Status: DC

## 2019-01-04 MED ORDER — MORPHINE SULFATE ER 15 MG PO TBCR: 15.0000 mg | EXTENDED_RELEASE_TABLET | Freq: Two times a day (BID) | ORAL | 0 refills | Status: AC

## 2019-01-04 MED ORDER — PREDNISONE 20 MG PO TABS: 20.0000 mg | ORAL_TABLET | Freq: Every day | ORAL | 1 refills | Status: AC

## 2019-01-04 MED ORDER — HYDROCODONE-ACETAMINOPHEN 5-325 MG PO TABS: 1.0000 | ORAL_TABLET | Freq: Four times a day (QID) | ORAL | 0 refills | Status: AC | PRN

## 2019-01-04 NOTE — Progress Notes (Addendum)
Established Patient Office Visit  Subjective:  Patient ID: Sophia Austin, female    DOB: [birth year]  Age: 68 y.o. MRN: [id number]  CC:  Chief Complaint  Patient presents with  . Establish Care    HPI Sophia Austin presents for establishment of care and refill of needed medicines.  Significant past medical history of lung cancer that appears to have/is involved her right bronchus over the last 2 years.  It has been treated in the past with radiation and chemotherapy.  She is scheduled to see her oncologist on the second of next month.  She has significant pain from this cancer she reports.  She tells me that her oncologist back in [place] has been treating her pain with morphine 15 mg twice daily, Norco every 6 as needed and Neurontin 600 mg 3 times daily.  She is unaware of any bony metastases.  She tells me the majority of her pain is in her right shoulder.  She tells me that she has been told it is referred pain.  She has a history of COPD.  She is dependent on oxygen therapy 24/7.  She is on chronic prednisone therapy due to frequent exacerbations requiring emergency room visits.  She had been on 10 mg of prednisone daily that was recently increased to 20 mg daily 6 weeks ago.  She was unable to have a flu shot this year because of her cancer treatment.  She has a history of paroxysmal atrial fibrillation.  She tells me that she has had a "light heart attack".  She has had ablation therapy.  She recently moved into this area from [place] to be with her daughter.  She is not married and single.  She does not currently smoke.  She does not drink alcohol or use illicit drugs.  She had been taking Xanax she tells me her depression but has been out of it for quite some time.  She had taken Ambien in the past and had what sounds like sleep aberration.  Past Medical History:  Diagnosis Date  . Atrial fibrillation ([place])   . Cancer ([place])   . COPD (chronic obstructive pulmonary disease)  ([place])     History reviewed. No pertinent surgical history.  History reviewed. No pertinent family history.  Social History   Socioeconomic History  . Marital status: Single    Spouse name: Not on file  . Number of children: Not on file  . Years of education: Not on file  . Highest education level: Not on file  Occupational History  . Not on file  Social Needs  . Financial resource strain: Not on file  . Food insecurity:    Worry: Not on file    Inability: Not on file  . Transportation needs:    Medical: Not on file    Non-medical: Not on file  Tobacco Use  . Smoking status: Former Research scientist (life sciences)  . Smokeless tobacco: Never Used  Substance and Sexual Activity  . Alcohol use: Never    Frequency: Never  . Drug use: Never  . Sexual activity: Never  Lifestyle  . Physical activity:    Days per week: Not on file    Minutes per session: Not on file  . Stress: Not on file  Relationships  . Social connections:    Talks on phone: Not on file    Gets together: Not on file    Attends religious service: Not on file    Active member of club or organization: Not  on file    Attends meetings of clubs or organizations: Not on file    Relationship status: Not on file  . Intimate partner violence:    Fear of current or ex partner: Not on file    Emotionally abused: Not on file    Physically abused: Not on file    Forced sexual activity: Not on file  Other Topics Concern  . Not on file  Social History Narrative  . Not on file    Outpatient Medications Prior to Visit  Medication Sig Dispense Refill  . albuterol (ACCUNEB) 1.25 MG/3ML nebulizer solution Take 1 ampule by nebulization every 6 (six) hours as needed for wheezing.    Marland Kitchen albuterol (PROVENTIL HFA;VENTOLIN HFA) 108 (90 Base) MCG/ACT inhaler Inhale 2 puffs into the lungs every 6 (six) hours as needed for wheezing or shortness of breath.    Marland Kitchen apixaban (ELIQUIS) 5 MG TABS tablet Take 5 mg by mouth 2 (two) times daily.    Marland Kitchen  atorvastatin (LIPITOR) 40 MG tablet Take 40 mg by mouth daily.    . carvedilol (COREG) 6.25 MG tablet Take 6.25 mg by mouth 2 (two) times daily with a meal.    . diltiazem (TIAZAC) 180 MG 24 hr capsule Take 180 mg by mouth daily.    . Fluticasone-Umeclidin-Vilant (TRELEGY ELLIPTA) 100-62.5-25 MCG/INH AEPB Inhale 1 puff into the lungs daily.     . pantoprazole (PROTONIX) 40 MG tablet Take 40 mg by mouth daily.    . sotalol (BETAPACE) 80 MG tablet Take 80 mg by mouth 2 (two) times daily.    . sucralfate (CARAFATE) 1 g tablet Take 1 g by mouth 4 (four) times daily -  with meals and at bedtime.    . ALPRAZolam (XANAX) 1 MG tablet Take 1 mg by mouth at bedtime as needed for anxiety.    . gabapentin (NEURONTIN) 600 MG tablet Take 1 tablet (600 mg total) by mouth 3 (three) times daily for 7 days. 21 tablet 0  . HYDROcodone-acetaminophen (NORCO/VICODIN) 5-325 MG tablet Take 1 tablet by mouth every 6 (six) hours as needed for moderate pain. 20 tablet 0  . morphine (MS CONTIN) 15 MG 12 hr tablet Take 15 mg by mouth every 12 (twelve) hours. 2 tabs by mouth in the morning' 1 tab in the afternoon and 2 tabs at bedtime    . predniSONE (DELTASONE) 20 MG tablet 2 tabs po daily x 3 days 6 tablet 0   No facility-administered medications prior to visit.     Allergies  Allergen Reactions  . Aspirin Hives and Rash    ROS Review of Systems  Constitutional: Positive for fatigue. Negative for chills, diaphoresis, fever and unexpected weight change.  HENT: Negative.   Eyes: Negative for photophobia and visual disturbance.  Respiratory: Positive for shortness of breath. Negative for chest tightness and wheezing.   Cardiovascular: Positive for chest pain and palpitations.  Gastrointestinal: Negative.   Genitourinary: Negative.   Musculoskeletal: Negative for gait problem and joint swelling.  Skin: Negative for pallor and rash.  Allergic/Immunologic: Positive for immunocompromised state.  Neurological: Negative  for light-headedness and headaches.  Hematological: Bruises/bleeds easily.      Objective:    Physical Exam  Constitutional: She is cooperative.  Non-toxic appearance. She has a sickly appearance. She appears ill. No distress.  HENT:  Head: Normocephalic and atraumatic.  Eyes: Pupils are equal, round, and reactive to light. Conjunctivae and EOM are normal.  Neck: Neck supple. No JVD present. No  thyroid mass and no thyromegaly present.  Cardiovascular: Normal rate and regular rhythm. Exam reveals distant heart sounds.  Pulmonary/Chest: No accessory muscle usage. No respiratory distress. She has decreased breath sounds. She has no wheezes. She has no rhonchi. She has no rales.  Neurological: She is alert.  Skin: Bruising and ecchymosis noted.  Psychiatric: Her speech is normal and behavior is normal. Her mood appears not anxious. She does not exhibit a depressed mood.    BP 136/80   Pulse 87   Temp 97.8 F (36.6 C) (Oral)   Ht 5\' 4"  (1.626 m)   Wt 158 lb (71.7 kg)   SpO2 93%   BMI 27.12 kg/m  Wt Readings from Last 3 Encounters:  01/04/19 158 lb (71.7 kg)  01/03/19 158 lb (71.7 kg)  01/02/19 160 lb (72.6 kg)   BP Readings from Last 3 Encounters:  01/04/19 136/80  01/03/19 115/61  01/02/19 101/62   Guideline developer:  UpToDate (see UpToDate for funding source) Date Released: June 2014  Health Maintenance Due  Topic Date Due  . Hepatitis C Screening  January 11, 1951  . HIV Screening  07/19/1968  . TETANUS/TDAP  07/19/1972  . PAP SMEAR-Modifier  07/19/1974  . MAMMOGRAM  07/20/2003  . COLONOSCOPY  07/20/2003  . INFLUENZA VACCINE  05/20/2018  . DEXA SCAN  07/19/2018  . PNA vac Low Risk Adult (1 of 2 - PCV13) 07/19/2018    There are no preventive care reminders to display for this patient.  No results found for: TSH Lab Results  Component Value Date   WBC 8.6 01/03/2019   HGB 9.2 (L) 01/03/2019   HCT 32.1 (L) 01/03/2019   MCV 85.4 01/03/2019   PLT 295 01/03/2019    Lab Results  Component Value Date   NA 143 01/03/2019   K 3.4 (L) 01/03/2019   CO2 28 01/03/2019   GLUCOSE 95 01/03/2019   BUN 24 (H) 01/03/2019   CREATININE 0.77 01/03/2019   BILITOT 0.6 01/02/2019   ALKPHOS 56 01/02/2019   AST 17 01/02/2019   ALT 18 01/02/2019   PROT 6.4 (L) 01/02/2019   ALBUMIN 3.6 01/02/2019   CALCIUM 9.1 01/03/2019   ANIONGAP 9 01/03/2019   No results found for: CHOL No results found for: HDL No results found for: LDLCALC No results found for: TRIG No results found for: CHOLHDL No results found for: HGBA1C    Assessment & Plan:   Problem List Items Addressed This Visit      Cardiovascular and Mediastinum   Paroxysmal atrial fibrillation ([place]) - Primary   Relevant Orders   Ambulatory referral to Cardiology   ASCVD (arteriosclerotic cardiovascular disease)   Relevant Orders   Ambulatory referral to Cardiology     Respiratory   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 gabapentin (NEURONTIN) 600 MG tablet    Sig: Take 1 tablet (600 mg total) by mouth 3 (three) times  daily for 30 days.    Dispense:  90 tablet    Refill:  0  . HYDROcodone-acetaminophen (NORCO/VICODIN) 5-325 MG tablet    Sig: Take 1 tablet by mouth every 6 (six) hours as needed for up to 30 days for moderate pain.    Dispense:  120 tablet    Refill:  0  . DISCONTD: morphine (MS CONTIN) 15 MG 12 hr tablet    Sig: Take 1 tablet (15 mg total) by mouth every  12 (twelve) hours for 30 days. 2 tabs by mouth in the morning' 1 tab in the afternoon and 2 tabs at bedtime    Dispense:  60 tablet    Refill:  0  . predniSONE (DELTASONE) 20 MG tablet    Sig: Take 1 tablet (20 mg total) by mouth daily with breakfast for 30 days.    Dispense:  30 tablet    Refill:  1  . morphine (MS CONTIN) 15 MG 12 hr tablet    Sig: Take 1 tablet (15 mg total) by mouth every 12 (twelve) hours.    Dispense:  60 tablet    Refill:  0    Follow-up: Return in about 3 months (around 04/06/2019).   Patient has follow-up appointment with Dr. Marin Olp on April 2.  We will have her establish with pulmonology and cardiology.  She will follow-up with me for treatment of her depression once things are settled for her more significant medical issues.

## 2019-01-04 NOTE — Telephone Encounter (Signed)
Copied from [place] [id number]. Topic: Quick Communication - Rx Refill/Question >> Jan 04, 2019 12:43 PM [place], [place] D wrote: Medication: morphine (MS CONTIN) 15 MG 12 hr tablet / Pharmacy needs clarification on instructions. They stated there are 3 different sets of instructions. Please advise.  Has the patient contacted their pharmacy? Yes.   (Agent: If no, request that the patient contact the pharmacy for the refill.) (Agent: If yes, when and what did the pharmacy advise?)  Preferred Pharmacy (with phone number or street name): [id number] - [place], [address] AT [address] [phone] (Phone) [phone] (Fax)    Agent: Please be advised that RX refills may take up to 3 business days. We ask that you follow-up with your pharmacy.

## 2019-01-04 NOTE — Telephone Encounter (Signed)
Dr. Ethelene Hal is re-sending corrected Rx for 1 tablet every 12 hours to the pharmacy. #60.

## 2019-01-04 NOTE — Addendum Note (Signed)
Addended by: Jon Billings on: 01/04/2019 01:13 PM   Modules accepted: Orders

## 2019-01-06 ENCOUNTER — Other Ambulatory Visit: Payer: Self-pay | Admitting: Hematology

## 2019-01-06 DIAGNOSIS — D649 Anemia, unspecified: Secondary | ICD-10-CM | POA: Insufficient documentation

## 2019-01-06 DIAGNOSIS — C3401 Malignant neoplasm of right main bronchus: Secondary | ICD-10-CM

## 2019-01-07 ENCOUNTER — Inpatient Hospital Stay: Payer: Medicare HMO | Admitting: Hematology

## 2019-01-07 ENCOUNTER — Inpatient Hospital Stay: Payer: Medicare HMO | Attending: Hematology

## 2019-01-07 ENCOUNTER — Encounter: Payer: Self-pay | Admitting: *Deleted

## 2019-01-07 NOTE — Progress Notes (Signed)
Patient was supposed to be seen today for new patient appointment. She is transferring care after moving to the area. She missed today's appointment.   Called patient to reach out about her missed appointment today and unable to reach her. Message left requesting call back.

## 2019-01-10 ENCOUNTER — Telehealth: Payer: Self-pay

## 2019-01-10 ENCOUNTER — Ambulatory Visit: Payer: Medicare HMO | Admitting: Cardiology

## 2019-01-10 NOTE — Telephone Encounter (Signed)
Called to see if patient is willing to come in a little earlier for appt

## 2019-01-20 ENCOUNTER — Ambulatory Visit: Payer: Medicare HMO | Admitting: Hematology & Oncology

## 2019-01-20 ENCOUNTER — Other Ambulatory Visit: Payer: Medicare HMO

## 2019-04-06 ENCOUNTER — Ambulatory Visit: Payer: Medicare HMO | Admitting: Family Medicine

## 2020-03-04 IMAGING — CT CT ANGIOGRAPHY CHEST
1 of 9 series · 3 of 16 positions shown · IV contrast (iopamidol)
Comparison: Current chest radiograph.

CLINICAL DATA: Pt with COPD experiencing increasing SOB since trip
from [place] to [HOSPITAL] a few days ago, on oxygen, hx AFIBHx lung
cancer with chemo and radiation

EXAM:
CT ANGIOGRAPHY CHEST WITH CONTRAST
TECHNIQUE: Multidetector CT imaging of the chest was performed using the
standard protocol during bolus administration of intravenous
contrast. Multiplanar CT image reconstructions and MIPs were
obtained to evaluate the vascular anatomy.
CONTRAST:  71mL SJXWDI-X85 IOPAMIDOL (SJXWDI-X85) INJECTION 76%

[Series 5: pe thins · axial · 0.69mm/px · z∈[-313,+6]mm · 3 of 320 slices shown]
[im 1/320  lung]
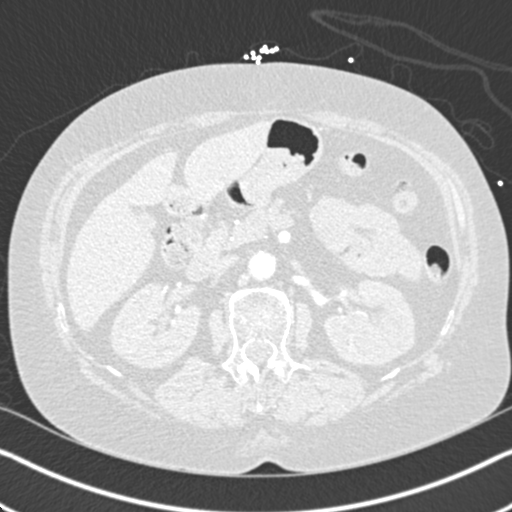
[im 160/320  soft-tissue]
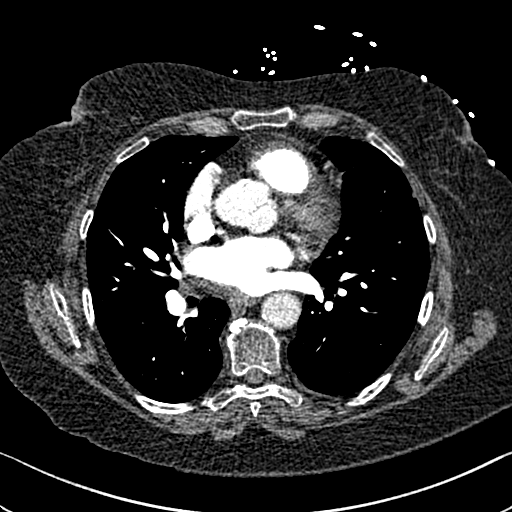
[im 320/320  lung]
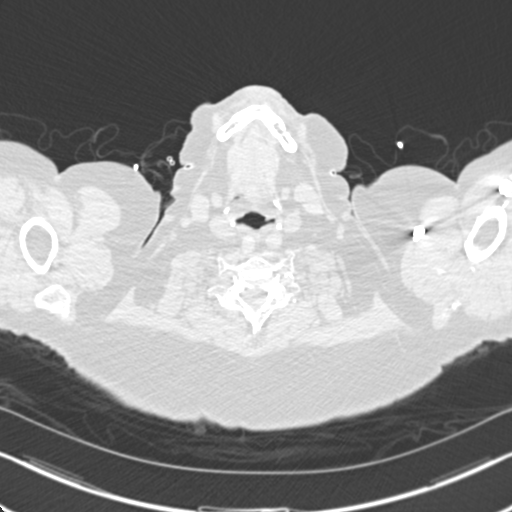

[3 of 16 positions shown; findings below may reference images not displayed]

FINDINGS: Cardiovascular: There is satisfactory opacification of the pulmonary
arteries to the segmental level. There is no evidence of a pulmonary
embolism.

Heart is normal in size. No pericardial effusion. Minimal left
coronary artery calcifications. Main pulmonary artery is prominent
measuring 3.6 cm. Mild dilation of the right pulmonary artery,
cm. Aorta is normal in caliber. Mild aortic atherosclerotic disease
noted. No aortic dissection.

Mediastinum/Nodes: There Is abnormal soft tissue along the superior
right hilum measuring approximately 2.9 x 2.2 x 2.5 cm. The right
upper lobe bronchus is occluded at the level of the soft tissue.
This may be chronic due to treatment related radiation scarring.
Residual/recurrent lung carcinoma is not excluded.

No enlarged mediastinal, hilar, or axillary lymph nodes. Thyroid
gland, trachea, and esophagus demonstrate no significant findings.

Lungs/Pleura: There is patchy and linear/reticular opacities
extending posteriorly from the right hilar structures consistent
with treatment related scarring. One component of this centered on
image 38, series 6, is more nodular in configuration, measuring 17 x
11 x 10 mm. No other evidence of a nodule. No lung masses. Despite
occlusion of the right lobe bronchus, the upper lobe is aerated
presumably from collateral drift. Additional linear and reticular
type opacities noted most evident in the anteromedial left upper
lobe and at the lung bases consistent with a combination of scarring
and subsegmental atelectasis. There are bilateral Bochdalek's
hernias.

There is no evidence of pneumonia and no pulmonary edema.

Advanced changes of centrilobular emphysema are noted bilaterally.
No pleural effusion or pneumothorax.

Upper Abdomen: No acute abnormality.

Musculoskeletal: No acute fracture. No osteoblastic or osteolytic
lesions.

Review of the MIP images confirms the above findings.
IMPRESSION: 1. No evidence of a pulmonary embolism.
2. No acute findings. Lung base opacities noted on the current chest
radiograph are due to a combination of chronic appearing lung base
scarring and/or subsegmental atelectasis and bilateral Bochdalek's
hernias.
3. Abnormal soft tissue surrounds the superior right hilar
structures occluding the right upper lobe bronchus. This may be
chronic scarring from radiation therapy. However, recurrent/residual
tumor is also possible. Posterior to this is an irregular nodule
measuring 17 x 10 x 11 mm, confluent with linear/reticular
opacities. This may all be scarring. The nodular component could
reflect active neoplastic disease. Comparison with prior chest CTs,
if they are available, would be helpful to establish the chronicity
of these findings. Otherwise, recommend short-term follow-up with
repeat chest CT in 2 3 months to reassess for stability.
4. Advanced emphysema.
5. Mild aortic atherosclerosis.

Aortic Atherosclerosis (DMRIW-O71.1) and Emphysema (DMRIW-134.Q).
